# Patient Record
Sex: Female | Born: 2000 | Race: Asian | Hispanic: No | Marital: Single | Smoking: Never smoker
Health system: Southern US, Community
[De-identification: ages and names within clinical notes are randomized; demographics above are authoritative.]

## PROBLEM LIST (undated history)

## (undated) DIAGNOSIS — J45909 Unspecified asthma, uncomplicated: Secondary | ICD-10-CM

---
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
## 2022-01-16 DIAGNOSIS — F330 Major depressive disorder, recurrent, mild: Secondary | ICD-10-CM | POA: Diagnosis not present

## 2022-01-25 ENCOUNTER — Ambulatory Visit
Admission: EM | Admit: 2022-01-25 | Discharge: 2022-01-25 | Disposition: A | Discharge status: Home / Self Care | Payer: Medicaid Other | Attending: Emergency Medicine | Admitting: Emergency Medicine

## 2022-01-25 DIAGNOSIS — U071 COVID-19: Secondary | ICD-10-CM | POA: Insufficient documentation

## 2022-01-25 DIAGNOSIS — B349 Viral infection, unspecified: Secondary | ICD-10-CM | POA: Diagnosis not present

## 2022-01-25 LAB — SARS CORONAVIRUS 2 BY RT PCR: SARS Coronavirus 2 by RT PCR: POSITIVE — AB

## 2022-01-25 NOTE — Discharge Instructions (Addendum)
I have enclosed some information about viral respiratory infections that I hope you find helpful.  You are welcome to continue taking DayQuil and NyQuil as needed for your symptoms.  Ibuprofen may also be helpful.  The results of your COVID-19 test will be available to your MyChart this evening.  Thank you for visiting urgent care today.

## 2022-01-25 NOTE — ED Triage Notes (Signed)
Pt c/o congestion, runny nose, fatigue, and headaches for 4 days. She has been trying day/nyquil with no relief.

## 2022-01-25 NOTE — ED Provider Notes (Signed)
[facility]    CSN: [id number] Arrival date & time: 01/25/22  2202    HISTORY   Chief Complaint  Patient presents with   Nasal Congestion   Headache   Fatigue   HPI Pamela Banks is a pleasant, 21 y.o. female who presents to urgent care today. Pt c/o congestion, runny nose, fatigue, and headaches for 4 days. States she has been trying day/nyquil with no relief.  Patient denies fever, aches, chills, nausea, vomiting, diarrhea.  The history is provided by the patient.   Past Medical History:  Diagnosis Date   Asthma    There are no problems to display for this patient.  History reviewed. No pertinent surgical history. OB History   No obstetric history on file.    Home Medications    Prior to Admission medications   Not on File    Family History History reviewed. No pertinent family history. Social History Social History   Tobacco Use   Smoking status: Never  Substance Use Topics   Alcohol use: No   Drug use: No   Allergies   Sulfa antibiotics  Review of Systems Review of Systems Pertinent findings revealed after performing a 14 point review of systems has been noted in the history of present illness.  Physical Exam Triage Vital Signs ED Triage Vitals  Enc Vitals Group     BP 03/29/21 0827 (!) 147/82     Pulse Rate 03/29/21 0827 72     Resp 03/29/21 0827 18     Temp 03/29/21 0827 98.3 F (36.8 C)     Temp Source 03/29/21 0827 Oral     SpO2 03/29/21 0827 98 %     Weight --      Height --      Head Circumference --      Peak Flow --      Pain Score 03/29/21 0826 5     Pain Loc --      Pain Edu? --      Excl. in GC? --   No data found.  Updated Vital Signs Pulse (!) 107   Temp 98.2 F (36.8 C) (Oral)   Resp 16   LMP 01/18/2022 (Approximate)   SpO2 98%   Physical Exam Vitals and nursing note reviewed.  Constitutional:      General: She is not in acute distress.    Appearance: Normal appearance. She is not ill-appearing.   HENT:     Head: Normocephalic and atraumatic.     Salivary Glands: Right salivary gland is not diffusely enlarged or tender. Left salivary gland is not diffusely enlarged or tender.     Right Ear: Tympanic membrane, ear canal and external ear normal. No drainage. No middle ear effusion. There is no impacted cerumen. Tympanic membrane is not erythematous or bulging.     Left Ear: Tympanic membrane, ear canal and external ear normal. No drainage.  No middle ear effusion. There is no impacted cerumen. Tympanic membrane is not erythematous or bulging.     Nose: Nose normal. No nasal deformity, septal deviation, mucosal edema, congestion or rhinorrhea.     Right Turbinates: Not enlarged, swollen or pale.     Left Turbinates: Not enlarged, swollen or pale.     Right Sinus: No maxillary sinus tenderness or frontal sinus tenderness.     Left Sinus: No maxillary sinus tenderness or frontal sinus tenderness.     Mouth/Throat:     Lips: Pink. No lesions.  Mouth: Mucous membranes are moist. No oral lesions.     Pharynx: Oropharynx is clear. Uvula midline. No posterior oropharyngeal erythema or uvula swelling.     Tonsils: No tonsillar exudate. 0 on the right. 0 on the left.  Eyes:     General: Lids are normal.        Right eye: No discharge.        Left eye: No discharge.     Extraocular Movements: Extraocular movements intact.     Conjunctiva/sclera: Conjunctivae normal.     Right eye: Right conjunctiva is not injected.     Left eye: Left conjunctiva is not injected.  Neck:     Trachea: Trachea and phonation normal.  Cardiovascular:     Rate and Rhythm: Normal rate and regular rhythm.     Pulses: Normal pulses.     Heart sounds: Normal heart sounds. No murmur heard.    No friction rub. No gallop.  Pulmonary:     Effort: Pulmonary effort is normal. No accessory muscle usage, prolonged expiration or respiratory distress.     Breath sounds: Normal breath sounds. No stridor, decreased air  movement or transmitted upper airway sounds. No decreased breath sounds, wheezing, rhonchi or rales.  Chest:     Chest wall: No tenderness.  Musculoskeletal:        General: Normal range of motion.     Cervical back: Normal range of motion and neck supple. Normal range of motion.  Lymphadenopathy:     Cervical: No cervical adenopathy.  Skin:    General: Skin is warm and dry.     Findings: No erythema or rash.  Neurological:     General: No focal deficit present.     Mental Status: She is alert and oriented to person, place, and time.  Psychiatric:        Mood and Affect: Mood normal.        Behavior: Behavior normal.     Visual Acuity Right Eye Distance:   Left Eye Distance:   Bilateral Distance:    Right Eye Near:   Left Eye Near:    Bilateral Near:     UC Couse / Diagnostics / Procedures:     Radiology No results found.  Procedures Procedures (including critical care time) EKG  Pending results:  Labs Reviewed  SARS CORONAVIRUS 2 BY RT PCR    Medications Ordered in UC: Medications - No data to display  UC Diagnoses / Final Clinical Impressions(s)   I have reviewed the triage vital signs and the nursing notes.  Pertinent labs & imaging results that were available during my care of the patient were reviewed by me and considered in my medical decision making (see chart for details).    Final diagnoses:  Viral illness   Patient advised that she likely has a common cold which typically last 7 to 10 days.  Continue DayQuil and NyQuil, ibuprofen as needed.  Return precautions advised. ED Prescriptions   None    PDMP not reviewed this encounter.  Disposition Upon Discharge:  Condition: stable for discharge home Home: take medications as prescribed; routine discharge instructions as discussed; follow up as advised.  Patient presented with an acute illness with associated systemic symptoms and significant discomfort requiring urgent management. In my opinion,  this is a condition that a prudent lay person (someone who possesses an average knowledge of health and medicine) may potentially expect to result in complications if not addressed urgently such as respiratory distress, impairment  of bodily function or dysfunction of bodily organs.   Routine symptom specific, illness specific and/or disease specific instructions were discussed with the patient and/or caregiver at length.   As such, the patient has been evaluated and assessed, work-up was performed and treatment was provided in alignment with urgent care protocols and evidence based medicine.  Patient/parent/caregiver has been advised that the patient may require follow up for further testing and treatment if the symptoms continue in spite of treatment, as clinically indicated and appropriate.  If the patient was tested for COVID-19, Influenza and/or RSV, then the patient/parent/guardian was advised to isolate at home pending the results of his/her diagnostic coronavirus test and potentially longer if they're positive. I have also advised pt that if his/her COVID-19 test returns positive, it's recommended to self-isolate for at least 10 days after symptoms first appeared AND until fever-free for 24 hours without fever reducer AND other symptoms have improved or resolved. Discussed self-isolation recommendations as well as instructions for household member/close contacts as per the [facility] and [place] DHHS, and also gave patient the COVID packet with this information.  Patient/parent/caregiver has been advised to return to the [facility] or PCP in 3-5 days if no better; to PCP or the Emergency Department if new signs and symptoms develop, or if the current signs or symptoms continue to change or worsen for further workup, evaluation and treatment as clinically indicated and appropriate  The patient will follow up with their current PCP if and as advised. If the patient does not currently have a PCP we will assist them in  obtaining one.   The patient may need specialty follow up if the symptoms continue, in spite of conservative treatment and management, for further workup, evaluation, consultation and treatment as clinically indicated and appropriate.  Patient/parent/caregiver verbalized understanding and agreement of plan as discussed.  All questions were addressed during visit.  Please see discharge instructions below for further details of plan.  Discharge Instructions:   Discharge Instructions      I have enclosed some information about viral respiratory infections that I hope you find helpful.  You are welcome to continue taking DayQuil and NyQuil as needed for your symptoms.  Ibuprofen may also be helpful.  The results of your COVID-19 test will be available to your MyChart this evening.  Thank you for visiting urgent care today.      This office note has been dictated using Teaching laboratory technician.  Unfortunately, this method of dictation can sometimes lead to typographical or grammatical errors.  I apologize for your inconvenience in advance if this occurs.  Please do not hesitate to reach out to me if clarification is needed.      Theadora Rama Scales, [place] 01/25/22 [phone]

## 2022-01-27 ENCOUNTER — Encounter (HOSPITAL_COMMUNITY): Payer: Self-pay

## 2022-01-30 DIAGNOSIS — F330 Major depressive disorder, recurrent, mild: Secondary | ICD-10-CM | POA: Diagnosis not present

## 2022-02-06 DIAGNOSIS — F330 Major depressive disorder, recurrent, mild: Secondary | ICD-10-CM | POA: Diagnosis not present

## 2022-02-13 DIAGNOSIS — F330 Major depressive disorder, recurrent, mild: Secondary | ICD-10-CM | POA: Diagnosis not present

## 2022-02-20 DIAGNOSIS — F330 Major depressive disorder, recurrent, mild: Secondary | ICD-10-CM | POA: Diagnosis not present

## 2022-02-27 DIAGNOSIS — F330 Major depressive disorder, recurrent, mild: Secondary | ICD-10-CM | POA: Diagnosis not present

## 2022-03-06 DIAGNOSIS — F330 Major depressive disorder, recurrent, mild: Secondary | ICD-10-CM | POA: Diagnosis not present

## 2022-03-13 DIAGNOSIS — F330 Major depressive disorder, recurrent, mild: Secondary | ICD-10-CM | POA: Diagnosis not present

## 2022-04-03 DIAGNOSIS — F330 Major depressive disorder, recurrent, mild: Secondary | ICD-10-CM | POA: Diagnosis not present

## 2022-04-05 DIAGNOSIS — F330 Major depressive disorder, recurrent, mild: Secondary | ICD-10-CM | POA: Diagnosis not present

## 2022-04-10 DIAGNOSIS — F330 Major depressive disorder, recurrent, mild: Secondary | ICD-10-CM | POA: Diagnosis not present

## 2022-04-17 DIAGNOSIS — F330 Major depressive disorder, recurrent, mild: Secondary | ICD-10-CM | POA: Diagnosis not present

## 2022-05-01 DIAGNOSIS — F330 Major depressive disorder, recurrent, mild: Secondary | ICD-10-CM | POA: Diagnosis not present

## 2022-05-15 DIAGNOSIS — F330 Major depressive disorder, recurrent, mild: Secondary | ICD-10-CM | POA: Diagnosis not present

## 2022-05-22 DIAGNOSIS — F330 Major depressive disorder, recurrent, mild: Secondary | ICD-10-CM | POA: Diagnosis not present

## 2022-05-29 DIAGNOSIS — F330 Major depressive disorder, recurrent, mild: Secondary | ICD-10-CM | POA: Diagnosis not present

## 2022-06-03 DIAGNOSIS — L700 Acne vulgaris: Secondary | ICD-10-CM | POA: Diagnosis not present

## 2022-06-12 DIAGNOSIS — F330 Major depressive disorder, recurrent, mild: Secondary | ICD-10-CM | POA: Diagnosis not present

## 2022-06-19 DIAGNOSIS — F330 Major depressive disorder, recurrent, mild: Secondary | ICD-10-CM | POA: Diagnosis not present

## 2022-06-28 DIAGNOSIS — F330 Major depressive disorder, recurrent, mild: Secondary | ICD-10-CM | POA: Diagnosis not present

## 2022-07-03 DIAGNOSIS — F330 Major depressive disorder, recurrent, mild: Secondary | ICD-10-CM | POA: Diagnosis not present

## 2022-07-10 DIAGNOSIS — F330 Major depressive disorder, recurrent, mild: Secondary | ICD-10-CM | POA: Diagnosis not present

## 2022-07-17 DIAGNOSIS — F330 Major depressive disorder, recurrent, mild: Secondary | ICD-10-CM | POA: Diagnosis not present

## 2022-07-24 DIAGNOSIS — F330 Major depressive disorder, recurrent, mild: Secondary | ICD-10-CM | POA: Diagnosis not present

## 2022-07-25 DIAGNOSIS — N61 Mastitis without abscess: Secondary | ICD-10-CM | POA: Diagnosis not present

## 2022-08-07 DIAGNOSIS — F330 Major depressive disorder, recurrent, mild: Secondary | ICD-10-CM | POA: Diagnosis not present

## 2022-08-14 DIAGNOSIS — F330 Major depressive disorder, recurrent, mild: Secondary | ICD-10-CM | POA: Diagnosis not present

## 2022-08-21 DIAGNOSIS — F330 Major depressive disorder, recurrent, mild: Secondary | ICD-10-CM | POA: Diagnosis not present

## 2022-08-28 DIAGNOSIS — F330 Major depressive disorder, recurrent, mild: Secondary | ICD-10-CM | POA: Diagnosis not present

## 2022-09-12 DIAGNOSIS — F330 Major depressive disorder, recurrent, mild: Secondary | ICD-10-CM | POA: Diagnosis not present

## 2022-09-16 DIAGNOSIS — Z111 Encounter for screening for respiratory tuberculosis: Secondary | ICD-10-CM | POA: Diagnosis not present

## 2022-09-24 DIAGNOSIS — Z23 Encounter for immunization: Secondary | ICD-10-CM | POA: Diagnosis not present

## 2022-09-25 DIAGNOSIS — F330 Major depressive disorder, recurrent, mild: Secondary | ICD-10-CM | POA: Diagnosis not present

## 2022-10-09 DIAGNOSIS — F330 Major depressive disorder, recurrent, mild: Secondary | ICD-10-CM | POA: Diagnosis not present

## 2022-10-10 DIAGNOSIS — L700 Acne vulgaris: Secondary | ICD-10-CM | POA: Diagnosis not present

## 2022-10-17 DIAGNOSIS — F330 Major depressive disorder, recurrent, mild: Secondary | ICD-10-CM | POA: Diagnosis not present

## 2022-10-24 DIAGNOSIS — F330 Major depressive disorder, recurrent, mild: Secondary | ICD-10-CM | POA: Diagnosis not present

## 2022-11-07 DIAGNOSIS — F330 Major depressive disorder, recurrent, mild: Secondary | ICD-10-CM | POA: Diagnosis not present

## 2022-11-10 DIAGNOSIS — L700 Acne vulgaris: Secondary | ICD-10-CM | POA: Diagnosis not present

## 2022-11-14 DIAGNOSIS — F330 Major depressive disorder, recurrent, mild: Secondary | ICD-10-CM | POA: Diagnosis not present

## 2022-11-27 DIAGNOSIS — F330 Major depressive disorder, recurrent, mild: Secondary | ICD-10-CM | POA: Diagnosis not present

## 2022-12-04 DIAGNOSIS — F330 Major depressive disorder, recurrent, mild: Secondary | ICD-10-CM | POA: Diagnosis not present

## 2022-12-08 DIAGNOSIS — L853 Xerosis cutis: Secondary | ICD-10-CM | POA: Diagnosis not present

## 2022-12-08 DIAGNOSIS — K130 Diseases of lips: Secondary | ICD-10-CM | POA: Diagnosis not present

## 2022-12-08 DIAGNOSIS — L700 Acne vulgaris: Secondary | ICD-10-CM | POA: Diagnosis not present

## 2022-12-12 DIAGNOSIS — F330 Major depressive disorder, recurrent, mild: Secondary | ICD-10-CM | POA: Diagnosis not present

## 2022-12-17 DIAGNOSIS — L700 Acne vulgaris: Secondary | ICD-10-CM | POA: Diagnosis not present

## 2022-12-26 DIAGNOSIS — F330 Major depressive disorder, recurrent, mild: Secondary | ICD-10-CM | POA: Diagnosis not present

## 2023-01-02 DIAGNOSIS — F330 Major depressive disorder, recurrent, mild: Secondary | ICD-10-CM | POA: Diagnosis not present

## 2023-01-08 DIAGNOSIS — F330 Major depressive disorder, recurrent, mild: Secondary | ICD-10-CM | POA: Diagnosis not present

## 2023-01-16 DIAGNOSIS — F330 Major depressive disorder, recurrent, mild: Secondary | ICD-10-CM | POA: Diagnosis not present

## 2023-01-19 DIAGNOSIS — J00 Acute nasopharyngitis [common cold]: Secondary | ICD-10-CM | POA: Diagnosis not present

## 2023-01-29 DIAGNOSIS — F330 Major depressive disorder, recurrent, mild: Secondary | ICD-10-CM | POA: Diagnosis not present

## 2023-02-05 DIAGNOSIS — F330 Major depressive disorder, recurrent, mild: Secondary | ICD-10-CM | POA: Diagnosis not present

## 2023-02-13 DIAGNOSIS — F330 Major depressive disorder, recurrent, mild: Secondary | ICD-10-CM | POA: Diagnosis not present

## 2023-02-19 DIAGNOSIS — F330 Major depressive disorder, recurrent, mild: Secondary | ICD-10-CM | POA: Diagnosis not present

## 2023-02-26 DIAGNOSIS — F330 Major depressive disorder, recurrent, mild: Secondary | ICD-10-CM | POA: Diagnosis not present

## 2023-03-12 DIAGNOSIS — F330 Major depressive disorder, recurrent, mild: Secondary | ICD-10-CM | POA: Diagnosis not present

## 2023-03-19 DIAGNOSIS — F330 Major depressive disorder, recurrent, mild: Secondary | ICD-10-CM | POA: Diagnosis not present

## 2023-04-02 DIAGNOSIS — F330 Major depressive disorder, recurrent, mild: Secondary | ICD-10-CM | POA: Diagnosis not present

## 2023-05-02 DIAGNOSIS — U071 COVID-19: Secondary | ICD-10-CM | POA: Diagnosis not present

## 2023-08-08 DIAGNOSIS — R0981 Nasal congestion: Secondary | ICD-10-CM | POA: Diagnosis not present

## 2023-08-08 DIAGNOSIS — R0989 Other specified symptoms and signs involving the circulatory and respiratory systems: Secondary | ICD-10-CM | POA: Diagnosis not present

## 2023-08-08 DIAGNOSIS — J069 Acute upper respiratory infection, unspecified: Secondary | ICD-10-CM | POA: Diagnosis not present

## 2023-09-10 DIAGNOSIS — F411 Generalized anxiety disorder: Secondary | ICD-10-CM | POA: Diagnosis not present

## 2023-09-10 DIAGNOSIS — F331 Major depressive disorder, recurrent, moderate: Secondary | ICD-10-CM | POA: Diagnosis not present

## 2023-10-01 DIAGNOSIS — F411 Generalized anxiety disorder: Secondary | ICD-10-CM | POA: Diagnosis not present

## 2023-10-01 DIAGNOSIS — F331 Major depressive disorder, recurrent, moderate: Secondary | ICD-10-CM | POA: Diagnosis not present

## 2023-12-11 DIAGNOSIS — F331 Major depressive disorder, recurrent, moderate: Secondary | ICD-10-CM | POA: Diagnosis not present

## 2023-12-11 DIAGNOSIS — F411 Generalized anxiety disorder: Secondary | ICD-10-CM | POA: Diagnosis not present

## 2024-01-21 DIAGNOSIS — Z1330 Encounter for screening examination for mental health and behavioral disorders, unspecified: Secondary | ICD-10-CM | POA: Diagnosis not present

## 2024-01-21 DIAGNOSIS — M25572 Pain in left ankle and joints of left foot: Secondary | ICD-10-CM | POA: Diagnosis not present

## 2024-02-10 DIAGNOSIS — Z23 Encounter for immunization: Secondary | ICD-10-CM | POA: Diagnosis not present
